# Patient Record
Sex: Female | Born: 1980
Health system: Southern US, Community
[De-identification: ages and names within clinical notes are randomized; demographics above are authoritative.]

---

## 2014-06-27 ENCOUNTER — Telehealth: Payer: Self-pay | Admitting: *Deleted

## 2014-06-27 DIAGNOSIS — IMO0002 Reserved for concepts with insufficient information to code with codable children: Secondary | ICD-10-CM

## 2014-06-27 NOTE — Telephone Encounter (Signed)
Ultrasound appt. 3/29 @ 1045.  Attempted to contact patient, no answer, left message for pt to contact clinic for Ultrasound appt.  Front office will contact patient for referral appt.

## 2014-06-27 NOTE — Telephone Encounter (Signed)
Patient called and left message stating she is returning our call about an ultrasound appt. Called patient and informed her of appt and directions to hospital. Patient verbalized understanding and had no questions

## 2014-07-05 ENCOUNTER — Encounter: Payer: Self-pay | Admitting: *Deleted

## 2014-07-05 ENCOUNTER — Ambulatory Visit (HOSPITAL_COMMUNITY)
Admission: RE | Admit: 2014-07-05 | Discharge: 2014-07-05 | Disposition: A | Discharge status: Home / Self Care | Payer: BLUE CROSS/BLUE SHIELD | Source: Ambulatory Visit | Attending: Obstetrics & Gynecology | Admitting: Obstetrics & Gynecology

## 2014-07-05 DIAGNOSIS — N8320 Unspecified ovarian cysts: Secondary | ICD-10-CM | POA: Diagnosis not present

## 2014-07-05 DIAGNOSIS — R109 Unspecified abdominal pain: Secondary | ICD-10-CM | POA: Diagnosis present

## 2014-07-05 DIAGNOSIS — N838 Other noninflammatory disorders of ovary, fallopian tube and broad ligament: Secondary | ICD-10-CM | POA: Diagnosis not present

## 2014-07-05 DIAGNOSIS — IMO0002 Reserved for concepts with insufficient information to code with codable children: Secondary | ICD-10-CM

## 2014-08-03 ENCOUNTER — Encounter: Payer: Self-pay | Admitting: Obstetrics & Gynecology

## 2016-05-26 IMAGING — US US TRANSVAGINAL NON-OB
1 series · 14 of 25 positions shown · non-contrast
Comparison: None

CLINICAL DATA: Patient with midline abdominal pain for multiple
years.

EXAM:
TRANSABDOMINAL AND TRANSVAGINAL ULTRASOUND OF PELVIS
TECHNIQUE: Both transabdominal and transvaginal ultrasound examinations of the
pelvis were performed. Transabdominal technique was performed for
global imaging of the pelvis including uterus, ovaries, adnexal
regions, and pelvic cul-de-sac. It was necessary to proceed with
endovaginal exam following the transabdominal exam to visualize the
adnexal structures.

[Series 1: us pelvis complete · 14 of 52 slices shown]
[im 1/52]
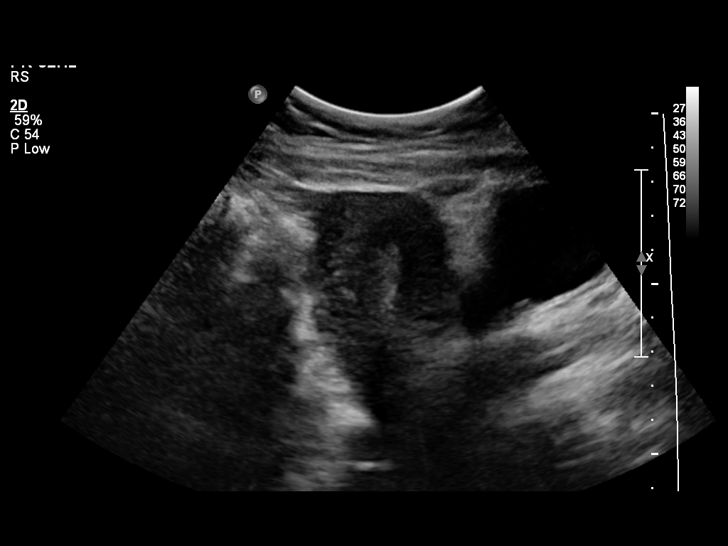
[im 5/52]
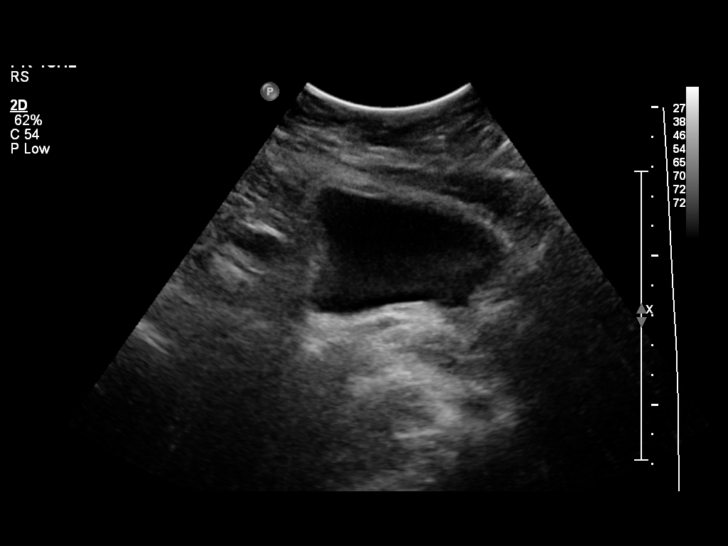
[im 9/52]
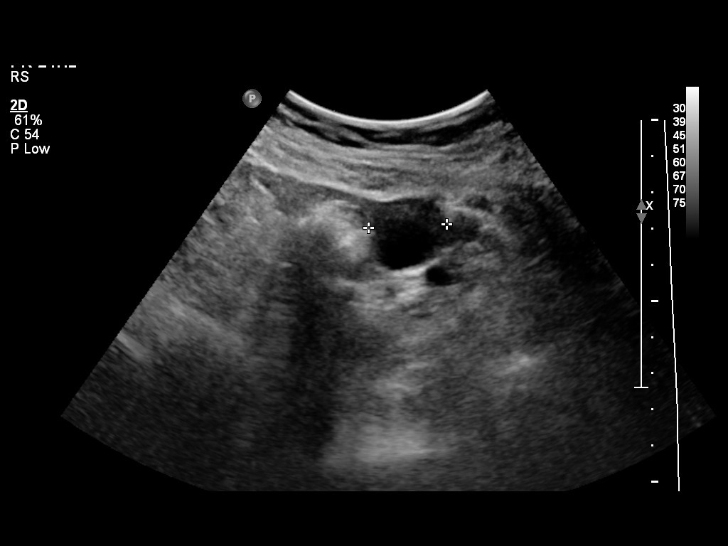
[im 13/52]
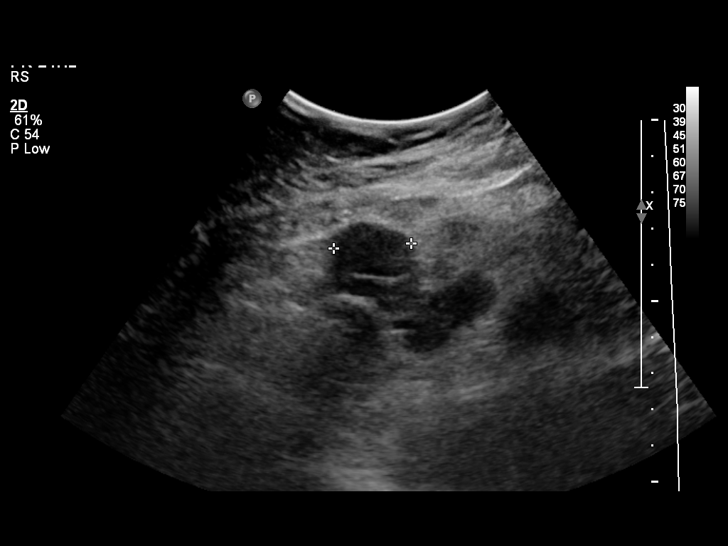
[im 18/52]
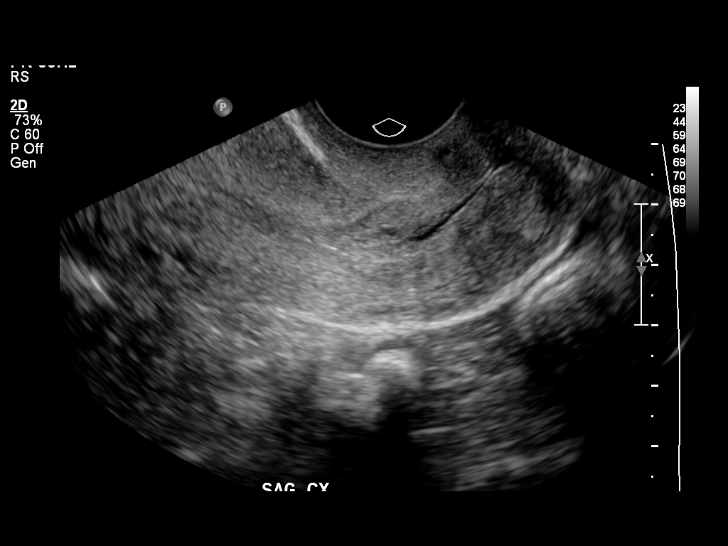
[im 20/52]
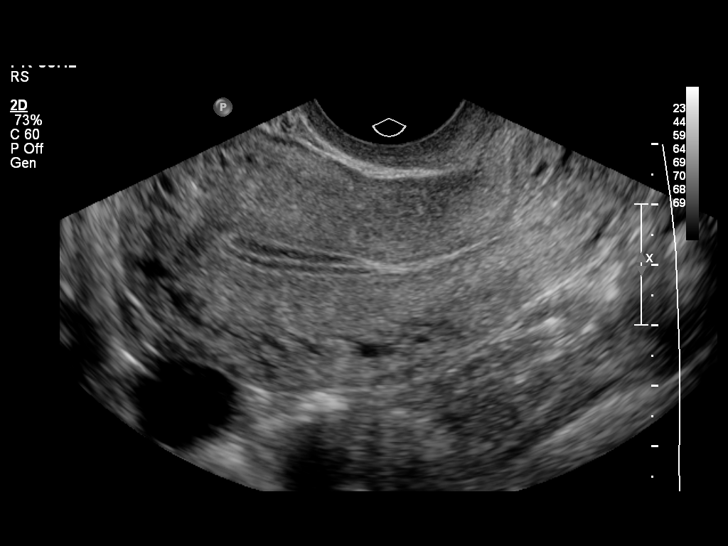
[im 24/52]
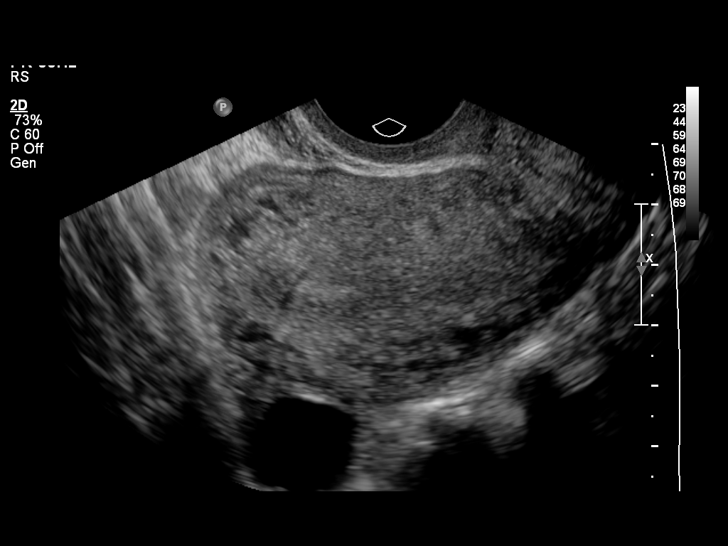
[im 28/52]
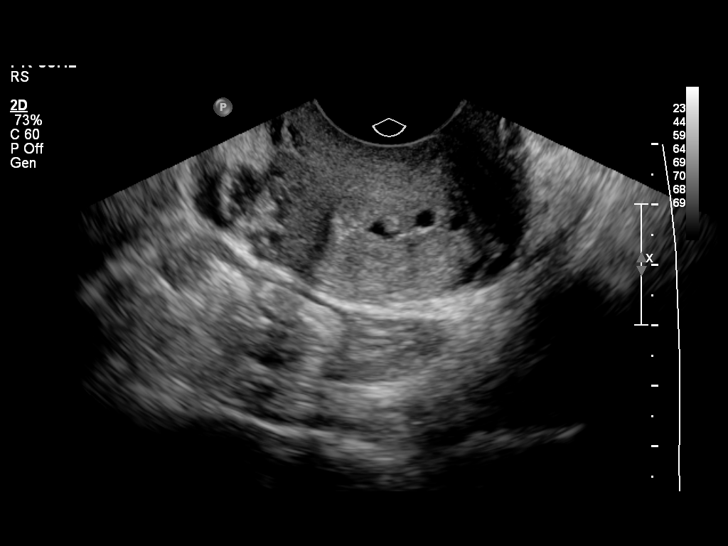
[im 32/52]
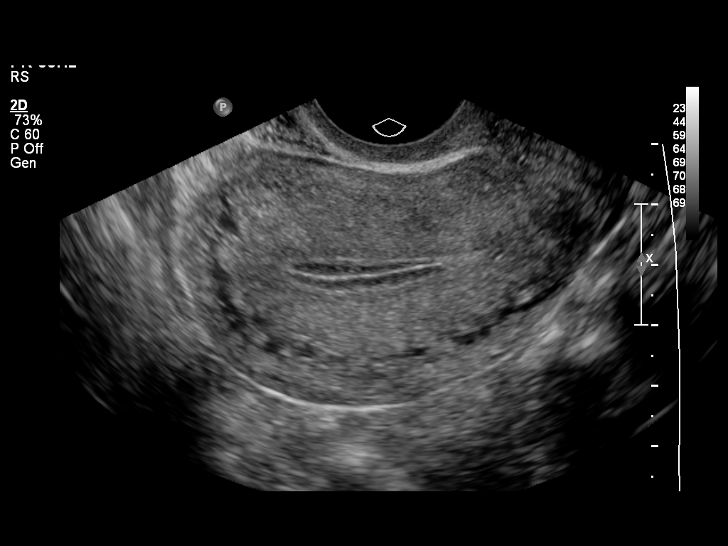
[im 35/52]
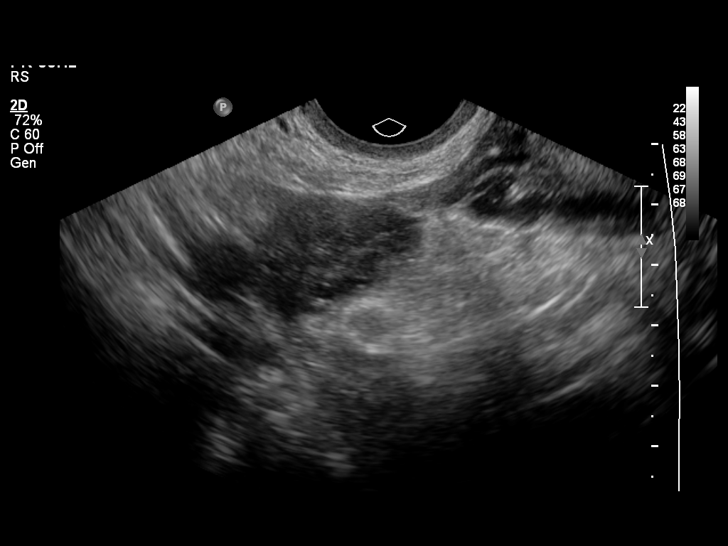
[im 39/52]
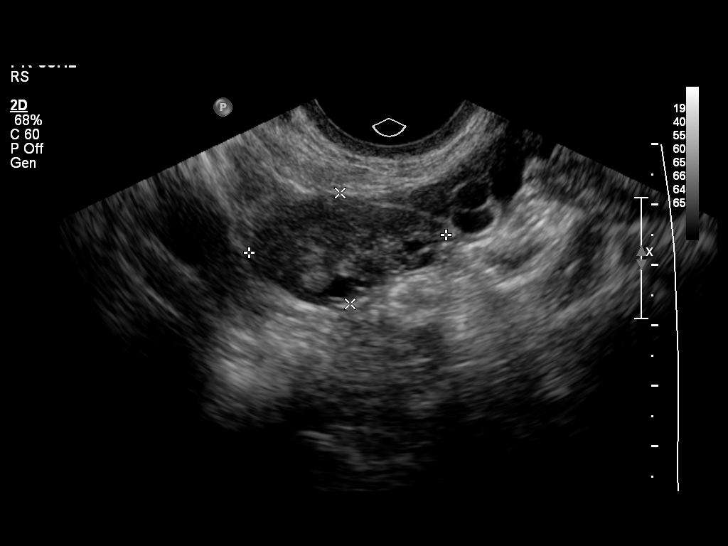
[im 43/52]
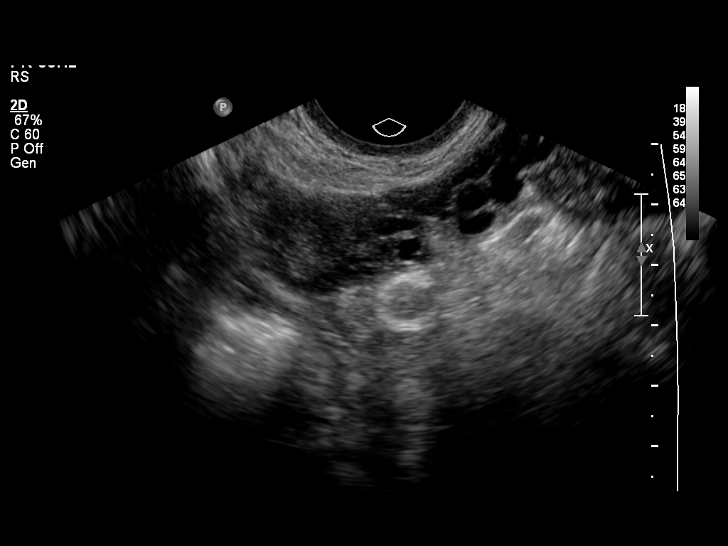
[im 47/52]
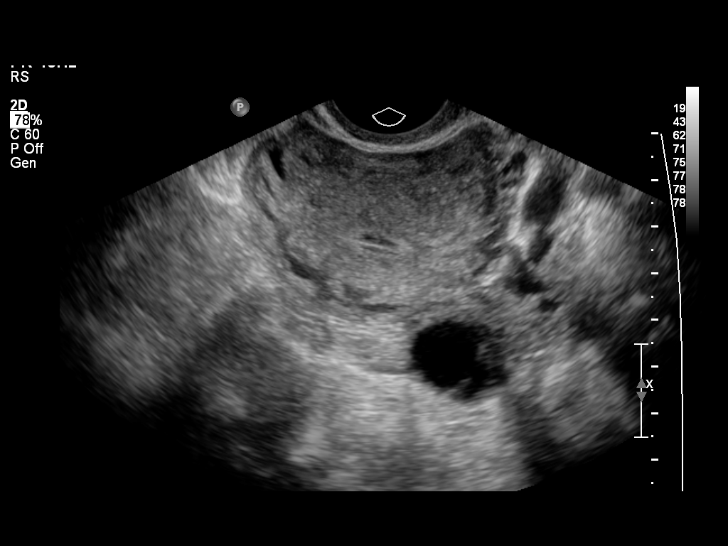
[im 52/52]
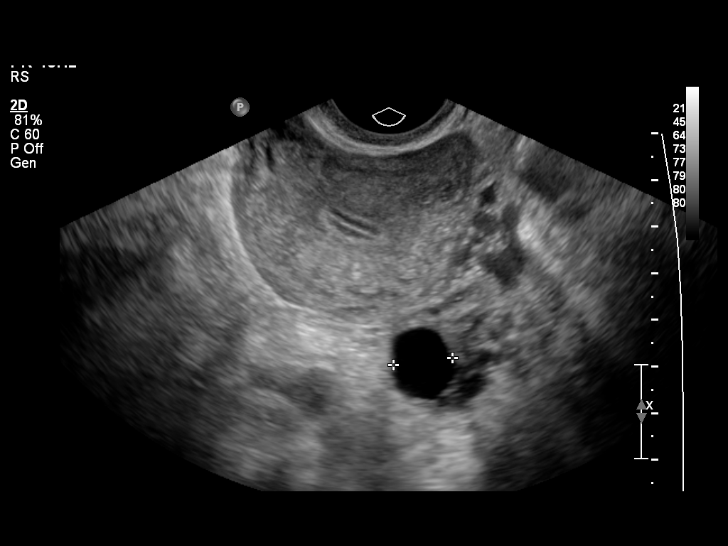

[14 of 25 positions shown; findings below may reference images not displayed]

FINDINGS: Uterus

Measurements: 10.0 x 4.5 x 6.0 cm. No fibroids or other mass
visualized.

Endometrium

Thickness: 6 mm.  No focal abnormality visualized.

Right ovary

Measurements: 2.9 x 1.5 x 2.1 cm. Normal appearance/no adnexal mass.

Left ovary

Measurements: 3.3 x 1.9 x 2.1 cm. Normal appearance/no adnexal mass.
Prominent left ovarian follicle versus small cyst.

Other findings

No free fluid.
IMPRESSION: Unremarkable pelvic ultrasound.
